# Patient Record
Sex: Female | Born: 1955 | Race: White | Hispanic: No | Smoking: Never smoker
Health system: Southern US, Community
[De-identification: ages and names within clinical notes are randomized; demographics above are authoritative.]

## PROBLEM LIST (undated history)

## (undated) HISTORY — PX: COLONOSCOPY: SHX174

## (undated) HISTORY — PX: KNEE ARTHROSCOPY: SUR90

## (undated) HISTORY — PX: BUNIONECTOMY: SHX129

## (undated) HISTORY — PX: FRACTURE SURGERY: SHX138

---

## 2006-01-27 ENCOUNTER — Ambulatory Visit: Payer: Self-pay | Admitting: Internal Medicine

## 2006-02-03 ENCOUNTER — Ambulatory Visit: Payer: Self-pay | Admitting: Internal Medicine

## 2011-03-27 ENCOUNTER — Other Ambulatory Visit: Payer: Self-pay | Admitting: Family Medicine

## 2011-03-27 ENCOUNTER — Other Ambulatory Visit (HOSPITAL_COMMUNITY)
Admission: RE | Admit: 2011-03-27 | Discharge: 2011-03-27 | Disposition: A | Discharge status: Home / Self Care | Payer: Managed Care, Other (non HMO) | Source: Ambulatory Visit | Attending: Family Medicine | Admitting: Family Medicine

## 2011-03-27 DIAGNOSIS — Z0000 Encounter for general adult medical examination without abnormal findings: Secondary | ICD-10-CM | POA: Insufficient documentation

## 2012-09-29 ENCOUNTER — Other Ambulatory Visit: Payer: Self-pay | Admitting: Family Medicine

## 2012-09-29 ENCOUNTER — Other Ambulatory Visit (HOSPITAL_COMMUNITY)
Admission: RE | Admit: 2012-09-29 | Discharge: 2012-09-29 | Disposition: A | Discharge status: Home / Self Care | Payer: BC Managed Care – PPO | Source: Ambulatory Visit | Attending: Family Medicine | Admitting: Family Medicine

## 2012-09-29 DIAGNOSIS — Z1211 Encounter for screening for malignant neoplasm of colon: Secondary | ICD-10-CM | POA: Insufficient documentation

## 2012-09-29 DIAGNOSIS — Z1151 Encounter for screening for human papillomavirus (HPV): Secondary | ICD-10-CM | POA: Insufficient documentation

## 2015-09-18 ENCOUNTER — Ambulatory Visit (INDEPENDENT_AMBULATORY_CARE_PROVIDER_SITE_OTHER): Payer: BLUE CROSS/BLUE SHIELD | Admitting: Sports Medicine

## 2015-09-18 ENCOUNTER — Encounter: Payer: Self-pay | Admitting: Sports Medicine

## 2015-09-18 VITALS — BP 100/60 | Ht 66.0 in | Wt 120.0 lb

## 2015-09-18 DIAGNOSIS — M25562 Pain in left knee: Secondary | ICD-10-CM

## 2015-09-18 MED ORDER — METHYLPREDNISOLONE ACETATE 40 MG/ML IJ SUSP
40.0000 mg | Freq: Once | INTRAMUSCULAR | Status: AC
  Administered 2015-09-18: 40 mg via INTRA_ARTICULAR

## 2015-09-18 NOTE — Progress Notes (Signed)
   Subjective:    Patient ID: Natalie Lutz, female    DOB: [birth year], 60 y.o.   MRN: [id number]  HPI chief complaint: Left knee pain  Very pleasant 60 year old female comes in today complaining of left knee pain for the past 2 months. Her pain began suddenly after she was sitting watching a movie and has persisted. 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 She took a month off but did not notice much difference. A couple of weeks ago she did start taking 400 mg of ibuprofen twice daily and using a compression sleeve. This did help some, but when she tried to return to tennis, she was unable to play due to worsening pain. Her pain is localized primarily along the medial aspect of the left knee. She denies any locking or catching. No feelings of giving way. She describes it as a stabbing type of pain. Pain improves at rest although swelling persists. She has a surgical history significant for bilateral knee arthroscopies done by Dr. Percell Miller 10 years ago. She has done well with both knees postoperatively up until this point. She denies any associated numbness or tingling. No fevers or chills. No pain more proximally in the groin.  Past medical history reviewed Surgical history reviewed. In addition to the aforementioned bilateral knee arthroscopies, she has also had 2 surgeries on her right foot for bunions. Last surgery was 4 years ago done by Dr. Beola Cord at [facility]. She takes no chronic medications No known drug allergies    Review of Systems    as above Objective:   Physical Exam  Well-developed, fit appearing. No acute distress. Vital signs reviewed  Left knee: Full range of motion. 1+ effusion. She is tender to palpation along the medial joint line with a positive Thessaly's. No tenderness along the lateral joint line. 1+ patellofemoral crepitus but a negative patellar grind. Knee is stable to valgus and varus stressing. Negative anterior drawer,  negative posterior drawer. No tenderness to palpation along the quadriceps or patellar tendons. Neurovascularly intact distally. Fairly neutral arches with standing. No obvious leg length discrepancy.  Brief ultrasound of the left knee was performed. Patient has an obvious effusion. Visualized portion of both the medial and lateral menisci show no obvious tear.      Assessment & Plan:  Left knee pain and swelling secondary to medial compartmental DJD versus possible degenerative meniscal tear Status post remote bilateral knee arthroscopies  Patient's left knee is injected with cortisone today after risks and benefits were explained. An anterior lateral approach was utilized. Patient tolerated this without difficulty. She is instructed to not play tennis for 2 more weeks. She can then increase activity as tolerated. She needs to continue with her compression sleeve when playing. She will follow-up with me in 4 weeks. If symptoms improve, then I will educate her in a home exercise program. However, if pain or swelling persists, we will need to start with getting some plain x-rays and possibly an MRI. Patient is encouraged to call with questions or concerns prior to her follow-up visit.  Consent obtained and verified. Time-out conducted. Noted no overlying erythema, induration, or other signs of local infection. Skin prepped in a sterile fashion. Topical analgesic spray: Ethyl chloride. Joint: left knee Needle: 22g 1.5 inch Completed without difficulty. Meds: 3cc 1% xylocaine, 1cc (40mg ) depomedrol  Advised to call if fevers/chills, erythema, induration, drainage, or persistent bleeding.

## 2015-09-24 ENCOUNTER — Ambulatory Visit (INDEPENDENT_AMBULATORY_CARE_PROVIDER_SITE_OTHER): Payer: BLUE CROSS/BLUE SHIELD

## 2015-09-24 ENCOUNTER — Ambulatory Visit (HOSPITAL_COMMUNITY)
Admission: EM | Admit: 2015-09-24 | Discharge: 2015-09-24 | Disposition: A | Discharge status: Home / Self Care | Payer: BLUE CROSS/BLUE SHIELD | Attending: Family Medicine | Admitting: Family Medicine

## 2015-09-24 ENCOUNTER — Encounter (HOSPITAL_COMMUNITY): Payer: Self-pay | Admitting: Emergency Medicine

## 2015-09-24 DIAGNOSIS — S52501A Unspecified fracture of the lower end of right radius, initial encounter for closed fracture: Secondary | ICD-10-CM

## 2015-09-24 MED ORDER — HYDROCODONE-ACETAMINOPHEN 5-325 MG PO TABS
1.0000 | ORAL_TABLET | Freq: Once | ORAL | Status: AC
  Administered 2015-09-24: 1 via ORAL

## 2015-09-24 MED ORDER — HYDROCODONE-ACETAMINOPHEN 5-325 MG PO TABS
ORAL_TABLET | ORAL | Status: AC
  Filled 2015-09-24: qty 1

## 2015-09-24 NOTE — ED Notes (Signed)
Pt fell an hour ago and caught herself with her right hand. PT reports pain in her right wrist and a slight pain in right forearm.

## 2015-09-24 NOTE — ED Notes (Signed)
Dr. Veronia Beets remains at bedside

## 2015-09-24 NOTE — Discharge Instructions (Signed)
See dr Amedeo Plenty and care as advised.

## 2015-09-24 NOTE — ED Notes (Signed)
Pt made aware that she cannot drive for the rest of the day or while taking oxycodone for pain.

## 2015-09-24 NOTE — ED Notes (Signed)
PT denies BP recheck prior to discharge.

## 2015-09-24 NOTE — Consult Note (Signed)
Natalie Lutz, [name] NO.:  [id number]  MEDICAL RECORD NO.:  [id number]  LOCATION:  [facility]                         FACILITY:  [place]  PHYSICIAN:  Satira Anis. Naod Sweetland, M.D.DATE OF BIRTH:  07-17-1955  DATE OF CONSULTATION: DATE OF DISCHARGE:  09/24/2015                                CONSULTATION   I had the pleasure of seeing Natalie Lutz for consultation today at the [facility].  Natalie Lutz is a pleasant 60 year old female, who fell at [facility] today sustained a displaced right distal radius fracture.  She complains of pain, swelling, and deformity.  She notes no locking, popping, catching.  At present time, she denies neck, back, chest, abdominal pain.  She denies numbness, tingling in the arm or fingers.  She is a delightful lady.  She is healthy and active.  PAST MEDICAL HISTORY:  None significant.  PAST SURGICAL HISTORY:  Reviewed.  ALLERGIES:  None.  MEDICINES:  None.  PHYSICAL EXAMINATION:  GENERAL:  A pleasant female, alert and oriented in no acute distress. VITAL SIGNS:  Stable. EXTREMITIES:  The patient has intact sensation to her finger tips. NECK, BACK, CHEST EXAMINATION:  Negative. LOWER EXTREMITY:  Nontender.  She walks with a nonantalgic gait.  She has swelling deformity about her right distal radius.  SKIN:  Intact.  No evidence of open injury, compartment syndrome, dystrophy, and infection.  I reviewed this with her at length and the findings at present time, she has obviously mildly displaced distal radius fracture.  Her x-rays show distal radius fracture displaced with some degree of early collapse and loss of angulation.  IMPRESSION:  Closed right distal radius fracture in an active 60 year old female, who enjoys tennis and other activities.  PLAN:  I have discussed with the patient the findings at present time. I would recommend splinting today followed by elective ORIF.  We have discussed relevant issues, do's and don'ts,  timeframe, duration of recovery, and other issues as they are to remain to her predicament.  We will plan to proceed with ORIF at a mutually convenient time in the future.  I applied a cast today/splint today myself, wrote for Oxy IR 5 mg 1-2 q.4-6 hours p.r.n. pain p.o. dispensed #50.  I recommended Peri-Colace, MiraLAX, as well as vitamin C 1000 mg, and yogurt and probiotic.  I have discussed with Ogechi the do's and don'ts, timeframe, duration of recovery, and other issues as they are to remain to her predicament.  We will get her scheduled at mutually convenient time in the future.     Satira Anis. Amedeo Plenty, M.D.     [facility]  D:  09/24/2015  T:  09/24/2015  Job:  [id number]

## 2015-09-24 NOTE — ED Provider Notes (Signed)
CSN: [id number]     Arrival date & time 09/24/15  1309 History   First MD Initiated Contact with Patient 09/24/15 1309     Chief Complaint  Patient presents with  . Wrist Pain   (Consider location/radiation/quality/duration/timing/severity/associated sxs/prior Treatment) Patient is a 60 y.o. female presenting with wrist pain. The history is provided by the patient.  Wrist Pain This is a new problem. The current episode started 1 to 2 hours ago (fell in [place] store onto right wrist.). The problem has not changed since onset.Pertinent negatives include no chest pain, no abdominal pain, no headaches and no shortness of breath.    History reviewed. No pertinent past medical history. Past Surgical History  Procedure Laterality Date  . Fracture surgery     No family history on file. Social History  Substance Use Topics  . Smoking status: Never Smoker   . Smokeless tobacco: None  . Alcohol Use: 0.0 oz/week    0 Standard drinks or equivalent per week   OB History    No data available     Review of Systems  Constitutional: Negative.   Respiratory: Negative for shortness of breath.   Cardiovascular: Negative for chest pain.  Gastrointestinal: Negative for abdominal pain.  Musculoskeletal: Positive for joint swelling. Negative for gait problem.  Skin: Negative.   Neurological: Negative for headaches.  All other systems reviewed and are negative.   Allergies  Review of patient's allergies indicates no known allergies.  Home Medications   Prior to Admission medications   Medication Sig Start Date End Date Taking? Authorizing Provider  valACYclovir (VALTREX) 1000 MG tablet TAKE 2 TABLETS BY MOUTH AS NEEDED FOR COLD SORES. REPEAT ONCE IN 12 HOURS EVERY 12 HOURS. 09/06/15   Historical Provider, MD  zolpidem (AMBIEN) 5 MG tablet TAKE 1 TABLET BY MOUTH AT BEDTIME AS NEEDED FOR INSOMNIA 09/06/15   Historical Provider, MD   Meds Ordered and Administered this Visit   Medications    HYDROcodone-acetaminophen (NORCO/VICODIN) 5-325 MG per tablet 1 tablet (1 tablet Oral Given 09/24/15 1350)    BP 142/68 mmHg  Pulse 76  Temp(Src) 99.5 F (37.5 C) (Oral)  Resp 16  SpO2 97% No data found.   Physical Exam  Constitutional: She is oriented to person, place, and time. She appears well-developed and well-nourished.  Musculoskeletal: She exhibits tenderness.       Right wrist: She exhibits decreased range of motion, tenderness, bony tenderness, swelling and deformity. She exhibits no effusion and no crepitus.       Arms: Neurological: She is alert and oriented to person, place, and time.  Skin: Skin is warm and dry.  Nursing note and vitals reviewed.   ED Course  Procedures (including critical care time)  Labs Review Labs Reviewed - No data to display  Imaging Review Dg Wrist Complete Right  09/24/2015  CLINICAL DATA:  Acute right wrist pain after fall today. Initial encounter. EXAM: RIGHT WRIST - COMPLETE 3+ VIEW COMPARISON:  None. FINDINGS: Mildly posteriorly displaced distal radial fracture is noted. This appears to be closed and posttraumatic. No other fracture or dislocation is noted. Joint spaces are intact. Soft tissues appear normal. IMPRESSION: Mildly displaced distal right radial fracture. Electronically Signed   By: Marijo Conception, M.D.   On: 09/24/2015 14:10     Visual Acuity Review  Right Eye Distance:   Left Eye Distance:   Bilateral Distance:    Right Eye Near:   Left Eye Near:    Bilateral Near:  MDM   1. Distal radius fracture, right, closed, initial encounter    Seen by dr Amedeo Plenty for care.   Billy Fischer, MD 09/24/15 Curly Rim

## 2015-10-14 ENCOUNTER — Ambulatory Visit: Payer: BLUE CROSS/BLUE SHIELD | Admitting: Sports Medicine

## 2015-12-24 ENCOUNTER — Other Ambulatory Visit (HOSPITAL_COMMUNITY): Payer: Self-pay | Admitting: Obstetrics and Gynecology

## 2015-12-25 ENCOUNTER — Other Ambulatory Visit (HOSPITAL_COMMUNITY): Payer: Self-pay | Admitting: Obstetrics and Gynecology

## 2015-12-31 NOTE — Patient Instructions (Signed)
Your procedure is scheduled on:  Wednesday, January 08, 2016  Enter through the [employer] [facility] at:  7:00 AM  Pick up the phone at the desk and dial [phone].  Call this number if you have problems the morning of surgery: [phone].  Remember: Do NOT eat food or drink after:  Midnight Tuesday  Take these medicines the morning of surgery with a SIP OF WATER:  None  Do NOT wear jewelry (body piercing), metal hair clips/bobby pins, make-up, or nail polish. Do NOT wear lotions, powders, or perfumes.  You may wear deodorant. Do NOT shave for 48 hours prior to surgery. Do NOT bring valuables to the hospital. Contacts, dentures, or bridgework may not be worn into surgery.  Leave suitcase in car.  After surgery it may be brought to your room.  For patients admitted to the hospital, checkout time is 11:00 AM the day of discharge.

## 2016-01-01 ENCOUNTER — Encounter (HOSPITAL_COMMUNITY): Payer: Self-pay

## 2016-01-01 ENCOUNTER — Other Ambulatory Visit: Payer: Self-pay

## 2016-01-01 ENCOUNTER — Encounter (HOSPITAL_COMMUNITY)
Admission: RE | Admit: 2016-01-01 | Discharge: 2016-01-01 | Disposition: A | Discharge status: Home / Self Care | Payer: BLUE CROSS/BLUE SHIELD | Source: Ambulatory Visit | Attending: Obstetrics and Gynecology | Admitting: Obstetrics and Gynecology

## 2016-01-01 DIAGNOSIS — R001 Bradycardia, unspecified: Secondary | ICD-10-CM | POA: Insufficient documentation

## 2016-01-01 DIAGNOSIS — Z01812 Encounter for preprocedural laboratory examination: Secondary | ICD-10-CM | POA: Diagnosis not present

## 2016-01-01 DIAGNOSIS — Z01810 Encounter for preprocedural cardiovascular examination: Secondary | ICD-10-CM | POA: Diagnosis not present

## 2016-01-01 LAB — TYPE AND SCREEN
ABO/RH(D): O NEG
Antibody Screen: NEGATIVE

## 2016-01-01 LAB — BASIC METABOLIC PANEL
Anion gap: 7 (ref 5–15)
BUN: 16 mg/dL (ref 6–20)
CHLORIDE: 104 mmol/L (ref 101–111)
CO2: 26 mmol/L (ref 22–32)
CREATININE: 0.82 mg/dL (ref 0.44–1.00)
Calcium: 10 mg/dL (ref 8.9–10.3)
GFR calc Af Amer: >60 mL/min (ref >60)
GFR calc non Af Amer: >60 mL/min (ref >60)
Glucose, Bld: 84 mg/dL (ref 65–99)
Potassium: 4.3 mmol/L (ref 3.5–5.1)
SODIUM: 137 mmol/L (ref 135–145)

## 2016-01-01 LAB — ABO/RH: ABO/RH(D): O NEG

## 2016-01-01 LAB — CBC
HCT: 39.5 % (ref 36.0–46.0)
Hemoglobin: 13.8 g/dL (ref 12.0–15.0)
MCH: 30.7 pg (ref 26.0–34.0)
MCHC: 34.9 g/dL (ref 30.0–36.0)
MCV: 88 fL (ref 78.0–100.0)
PLATELETS: 254 10*3/uL (ref 150–400)
RBC: 4.49 MIL/uL (ref 3.87–5.11)
RDW: 12.7 % (ref 11.5–15.5)
WBC: 5.1 10*3/uL (ref 4.0–10.5)

## 2016-01-01 NOTE — Pre-Procedure Instructions (Signed)
Dr. Hatchett reviewed EKG no new orders received at this time. 

## 2016-01-05 ENCOUNTER — Other Ambulatory Visit (HOSPITAL_COMMUNITY): Payer: Self-pay | Admitting: Obstetrics and Gynecology

## 2016-01-05 NOTE — H&P (Signed)
Chief Complaint(s):   preop history and phyiscal for 01/08/2016   HPI:  General 60 y/o presents for preop history and physical exam in preparation for vaginal hysterectomy/BSO with anterior repair to manage pelvic prolapse. she has pressue and discomfort due to the proplapse. she reports that 13 yrs ago her current partner stated that he " could not feel anything" when they have sex. she reports that she has significant discomfort during sex for the last 10 years.  Current Medication:  Taking  Zolpidem Tartrate 5 MG Tablet 1 tablet at bedtime Orally Once a day as needed for insomnia     Valtrex(ValACYclovir HCl) 1000mg  Tablet 2 tablets as needed for cold sores, repeat once in 12 hours by mouth every 12 hrs, Notes: as needed     Premarin(Estrogens Conjugated) 0.625 MG/GM Cream 1/2 gram per vagina twice a weekly Vaginal     Medication List reviewed and reconciled with the patient   Medical History:   h/o cold sores     sees Dr. Mickel Baas [place]/Dr Steinhoffer for skin checks every 1-2 years, h/o squamous cell CA, basal cell CA     osteopenia, -1.3     GYN--Dr McPhail, released to primary care     R distal radius fx 4/17 Dr Irena Reichmann      Allergies/Intolerance:   N.K.D.A.   Gyn History:   Sexual activity currently sexually active. Periods : postmenopausal. LMP 2011. Last pap smear date 09/29/12. Last mammogram date 03/27/15. Denies Abnormal pap smear. Denies STD.   OB History:   Number of pregnancies 3. Pregnancy # 1 live birth, vaginal delivery. Pregnancy # 2 live birth, vaginal delivery. Pregnancy # 3 live birth, vaginal delivery.   Surgical History:   B arthroscopic knee surgery--Dr Percell Miller 08     B bunionectomy 1977     redo, R bunionectomy, Dr Beola Cord 12/13     repair of broken right wrist 09/2015   Hospitalization:   childbirth 66, 26, 78   Family History:   Father: alive 70 yrs    Mother: deceased 52s yrs, lung issues--not a smoker    Paternal Grand Father: deceased    Paternal  Imbery Mother: deceased    Maternal Grand Father: deceased, diabetes, diagnosed with DM    Maternal Grand Mother: deceased    Brother 1: alive    Sister 1: alive    Sister 2: alive    1 brother(s) , 2 sister(s) - healthy.    denies family h/o gyn cancers.  Social History:  General Tobacco use cigarettes: Never smoked, Tobacco history last updated 12/25/2015.  no EXPOSURE TO PASSIVE SMOKE.  Alcohol: yes, wine, 2 glasses at a time, 3-5 times per week.  no Recreational drug use.  Exercise: yes, tennis 2 times per week, personal trainer 2 times per week.  Marital Status: Divorced 2001, stable relationship--since 2008, [place].  Children: 2, girls, 1, Boys, Estella Husk (married 3/16), Arvella Nigh (daughter)--Winston (1 granddaughter), married, Jamie--[place] (son).  OCCUPATION: employed, self employed, Museum/gallery curator, residential.  Religion: Tourist information centre manager.  Seat belt use: yes.  ROS: CONSTITUTIONAL none" options="no,yes" propid="91" itemid="[id number]" categoryid="10464" encounterid="[id number]"Fatigue none. none today" options="no,yes" propid="91" itemid="10467" categoryid="10464" encounterid="[id number]"Fever none today.  CARDIOLOGY none" options="no,yes" propid="91" itemid="[id number]" categoryid="10488" encounterid="[id number]"Chest pain none.  RESPIRATORY no" options="no" propid="91" itemid="270013" categoryid="138132" encounterid="[id number]"Shortness of breath no. no" options="no,yes" propid="91" itemid="172745" categoryid="138132" encounterid="[id number]"Cough no.  GASTROENTEROLOGY none" options="no,yes" propid="91" itemid="193447" categoryid="10494" encounterid="[id number]"Appetite change none. no" options="no,yes" propid="91" itemid="193449" categoryid="10494" encounterid="[id number]"Change in bowel habits no.  FEMALE REPRODUCTIVE no" options="no,yes" propid="91" itemid="196298" categoryid="10525" encounterid="[id number]"Breast lumps or discharge  no. none" options="no,yes" propid="91"  itemid="186083" categoryid="10525" encounterid="[id number]"Breast pain none. none" options="no,yes" propid="91" itemid="138198" categoryid="10525" encounterid="[id number]"Dyspareunia none. no" options="no,yes" propid="91" itemid="202654" categoryid="10525" encounterid="[id number]"Dysuria no. none" options="no,yes" propid="91" itemid="186082" categoryid="10525" encounterid="[id number]"Pelvic pain none. yes" options="no,yes" propid="91" itemid="199173" categoryid="10525" encounterid="[id number]"Regular menses yes. no" options="no,yes" propid="91" itemid="278230" categoryid="10525" encounterid="[id number]"Unusual vaginal discharge no. no" options="no,yes" propid="91" itemid="278942" categoryid="10525" encounterid="[id number]"Vaginal itching no. no" options="no,yes" propid="91" itemid="278837" categoryid="10525" encounterid="[id number]"Vulvar/labial lesion no.  NEUROLOGY none" options="no,yes" propid="91" itemid="193627" categoryid="12512" encounterid="[id number]"Migraines none. none" options="no,yes" propid="91" itemid="12514" categoryid="12512" encounterid="[id number]"Tingling/numbness none. none" options="no,yes" propid="91" itemid="193467" categoryid="12512" encounterid="[id number]"Visual changes none.  PSYCHOLOGY no" options="" propid="91" itemid="275919" categoryid="10520" encounterid="[id number]"Depression no.  SKIN no" options="no,yes" propid="91" itemid="269383" categoryid="202750" encounterid="[id number]"Rash no. no" options="no,yes" propid="91" itemid="202757" categoryid="202750" encounterid="[id number]"Suspicious lesions no.  ENDOCRINOLOGY none" options="no,yes" propid="91" itemid="202624" categoryid="12508" encounterid="[id number]"Hot flashes none. no unintentional" options="no,yes" propid="91" itemid="193436" categoryid="12508" encounterid="[id number]"Weight gain no unintentional. none" options="no,yes" propid="91" itemid="138164" categoryid="12508" encounterid="[id number]"Weight loss none.  HEMATOLOGY/LYMPH no" options="no,yes" propid="91"  itemid="193454" categoryid="138157" encounterid="[id number]"Anemia no.    Objective: Vitals:  Wt 119, Wt change 2 lb, Pulse sitting 66, BP sitting 107/61  Past Results:  Examination:  General Examination McCoy,Tiffany 12/25/2015 02:58:30 PM &gt; , for pelvic exam only" categoryPropId="[id number]" examid="[id number]"CHAPERONE PRESENT McCoy,Tiffany 12/25/2015 02:58:30 PM > , for pelvic exam only.  Physical Examination: GENERAL in NAD, pleasant"Patient appears in NAD, pleasant. well developed"Build: well developed. well-appearing, well-developed"General Appearance: well-appearing, well-developed. caucasian"Race: caucasian.  LUNGS clear to auscultation"Breath sounds: clear to auscultation. no"Dyspnea: no.  HEART none"Murmurs: none. normal"Rate: normal. regular"Rhythm: regular.  ABDOMEN no masses,tenderness,organomegaly, no CVAT"General: no masses,tenderness,organomegaly, no CVAT.  FEMALE GENITOURINARY no mass, non tender"Adnexa: no mass, non tender. normal, no lesions"Anus/perineum: normal, no lesions. normal appearance , no lesions/discharge/bleeding,, ..GRADE 2-3 uterine prolapse , external os normal "Cervix/ cuff: normal appearance , no lesions/discharge/bleeding,, ..GRADE 2-3 uterine prolapse , external os normal . normal, no lesions, no skin discoloration, no lymphadenopathy"External genitalia: normal, no lesions, no skin discoloration, no lymphadenopathy. deferred"Rectum: deferred. normal external meatus"Urethra: normal external meatus. normal size/shape/consistency, freely mobile, non tender"Uterus: normal size/shape/consistency, freely mobile, non tender. moderate cystocele.. small rectocele no lesions, no abnormal discharge, odorless"Vagina: moderate cystocele.. small rectocele no lesions, no abnormal discharge, odorless. normal, no lesions, no skin discoloration, non tender"Vulva: normal, no lesions, no skin discoloration, non tender.  EXTREMITIES FROM of all extremities"Extremities FROM of all  extremities.  NEUROLOGICAL normal"Gait: normal. alert and oriented x 3"Orientation: alert and oriented x 3.    Assessment: Assessment:  Uterine prolapse - N81.4 (Primary)     Midline cystocele - N81.11     Rectocele - N81.6     Plan: Treatment:  Uterine prolapse  Notes: pt desires definitive management via hysterectomy.. .. plan on vaginal hysterectomy with BSO .. modified mccalls/ culdoplasty.Marland Kitchen anterior repair.. d/w pt r/b/a of hysterectomy inclugind but not limited to infection/ bleeding damage to bowel bladder and ureters with the need for further surgery. r/o transfusion HIV / HEp B&C discussed . .. r/o conversion to laparotomy discussed. pt voiced understanding and desires to proceed.  Midline cystocele  Notes: plan anterior repair at the time of hysterectomy.  Rectocele  Notes: this is minimal ... this will most likely not need repair however will evaluate at the time of surgery if necessary will proceed wth posterior repair at that time.

## 2016-01-08 ENCOUNTER — Ambulatory Visit (HOSPITAL_COMMUNITY): Payer: BLUE CROSS/BLUE SHIELD | Admitting: Certified Registered Nurse Anesthetist

## 2016-01-08 ENCOUNTER — Encounter (HOSPITAL_COMMUNITY): Payer: Self-pay | Admitting: Certified Registered Nurse Anesthetist

## 2016-01-08 ENCOUNTER — Encounter (HOSPITAL_COMMUNITY)
Admission: RE | Disposition: A | Discharge status: Home / Self Care | Payer: Self-pay | Source: Ambulatory Visit | Attending: Obstetrics and Gynecology

## 2016-01-08 ENCOUNTER — Observation Stay (HOSPITAL_COMMUNITY)
Admission: RE | Admit: 2016-01-08 | Discharge: 2016-01-09 | Disposition: A | Discharge status: Home / Self Care | Payer: BLUE CROSS/BLUE SHIELD | Source: Ambulatory Visit | Attending: Obstetrics and Gynecology | Admitting: Obstetrics and Gynecology

## 2016-01-08 DIAGNOSIS — Z90710 Acquired absence of both cervix and uterus: Secondary | ICD-10-CM | POA: Diagnosis present

## 2016-01-08 DIAGNOSIS — N814 Uterovaginal prolapse, unspecified: Secondary | ICD-10-CM | POA: Diagnosis not present

## 2016-01-08 HISTORY — PX: SALPINGOOPHORECTOMY: SHX82

## 2016-01-08 HISTORY — PX: VAGINAL HYSTERECTOMY: SHX2639

## 2016-01-08 HISTORY — PX: CYSTOCELE REPAIR: SHX163

## 2016-01-08 SURGERY — HYSTERECTOMY, VAGINAL
Anesthesia: General | Site: Vagina

## 2016-01-08 MED ORDER — LIDOCAINE HCL (PF) 1 % IJ SOLN
INTRAMUSCULAR | Status: AC
  Filled 2016-01-08: qty 5

## 2016-01-08 MED ORDER — SENNA 8.6 MG PO TABS
1.0000 | ORAL_TABLET | Freq: Two times a day (BID) | ORAL | Status: DC
  Administered 2016-01-08: 8.6 mg via ORAL
  Filled 2016-01-08 (×3): qty 1

## 2016-01-08 MED ORDER — FENTANYL CITRATE (PF) 250 MCG/5ML IJ SOLN
INTRAMUSCULAR | Status: AC
  Filled 2016-01-08: qty 5

## 2016-01-08 MED ORDER — LIDOCAINE-EPINEPHRINE 1 %-1:100000 IJ SOLN
INTRAMUSCULAR | Status: AC
  Filled 2016-01-08: qty 2

## 2016-01-08 MED ORDER — LACTATED RINGERS IV SOLN
INTRAVENOUS | Status: DC
  Administered 2016-01-08 – 2016-01-09 (×3): via INTRAVENOUS

## 2016-01-08 MED ORDER — ALUM & MAG HYDROXIDE-SIMETH 200-200-20 MG/5ML PO SUSP: 30.0000 mL | ORAL | Status: DC | PRN

## 2016-01-08 MED ORDER — ZOLPIDEM TARTRATE 5 MG PO TABS: 5.0000 mg | ORAL_TABLET | Freq: Every evening | ORAL | Status: DC | PRN

## 2016-01-08 MED ORDER — HYDROMORPHONE HCL 1 MG/ML IJ SOLN: 0.2000 mg | INTRAMUSCULAR | Status: DC | PRN

## 2016-01-08 MED ORDER — GLYCOPYRROLATE 0.2 MG/ML IJ SOLN
INTRAMUSCULAR | Status: AC
  Filled 2016-01-08: qty 3

## 2016-01-08 MED ORDER — PROPOFOL 10 MG/ML IV BOLUS
INTRAVENOUS | Status: DC | PRN
  Administered 2016-01-08: 140 mg via INTRAVENOUS

## 2016-01-08 MED ORDER — MIDAZOLAM HCL 2 MG/2ML IJ SOLN
INTRAMUSCULAR | Status: DC | PRN
  Administered 2016-01-08: 2 mg via INTRAVENOUS

## 2016-01-08 MED ORDER — EPHEDRINE SULFATE 50 MG/ML IJ SOLN
INTRAMUSCULAR | Status: DC | PRN
  Administered 2016-01-08: 10 mg via INTRAVENOUS

## 2016-01-08 MED ORDER — HYDROMORPHONE HCL 1 MG/ML IJ SOLN
0.5000 mg | INTRAMUSCULAR | Status: DC | PRN
  Administered 2016-01-08 (×2): 0.5 mg via INTRAVENOUS

## 2016-01-08 MED ORDER — NEOSTIGMINE METHYLSULFATE 10 MG/10ML IV SOLN
INTRAVENOUS | Status: AC
  Filled 2016-01-08: qty 1

## 2016-01-08 MED ORDER — ONDANSETRON HCL 4 MG/2ML IJ SOLN
INTRAMUSCULAR | Status: DC | PRN
  Administered 2016-01-08: 4 mg via INTRAVENOUS

## 2016-01-08 MED ORDER — SIMETHICONE 80 MG PO CHEW: 80.0000 mg | CHEWABLE_TABLET | Freq: Four times a day (QID) | ORAL | Status: DC | PRN

## 2016-01-08 MED ORDER — MIDAZOLAM HCL 2 MG/2ML IJ SOLN
INTRAMUSCULAR | Status: AC
  Filled 2016-01-08: qty 2

## 2016-01-08 MED ORDER — SCOPOLAMINE 1 MG/3DAYS TD PT72
1.0000 | MEDICATED_PATCH | Freq: Once | TRANSDERMAL | Status: DC
  Administered 2016-01-08: 1.5 mg via TRANSDERMAL

## 2016-01-08 MED ORDER — ONDANSETRON HCL 4 MG/2ML IJ SOLN: 4.0000 mg | Freq: Once | INTRAMUSCULAR | Status: DC | PRN

## 2016-01-08 MED ORDER — KETOROLAC TROMETHAMINE 30 MG/ML IJ SOLN
INTRAMUSCULAR | Status: AC
  Filled 2016-01-08: qty 1

## 2016-01-08 MED ORDER — PROPOFOL 10 MG/ML IV BOLUS
INTRAVENOUS | Status: AC
  Filled 2016-01-08: qty 20

## 2016-01-08 MED ORDER — KETOROLAC TROMETHAMINE 30 MG/ML IJ SOLN
INTRAMUSCULAR | Status: DC | PRN
  Administered 2016-01-08: 30 mg via INTRAVENOUS

## 2016-01-08 MED ORDER — LIDOCAINE-EPINEPHRINE 1 %-1:100000 IJ SOLN
INTRAMUSCULAR | Status: DC | PRN
  Administered 2016-01-08: 9 mL
  Administered 2016-01-08: 20 mL

## 2016-01-08 MED ORDER — ARTIFICIAL TEARS OP OINT
TOPICAL_OINTMENT | OPHTHALMIC | Status: AC
  Filled 2016-01-08: qty 3.5

## 2016-01-08 MED ORDER — EPHEDRINE 5 MG/ML INJ
INTRAVENOUS | Status: AC
  Filled 2016-01-08: qty 10

## 2016-01-08 MED ORDER — SCOPOLAMINE 1 MG/3DAYS TD PT72
MEDICATED_PATCH | TRANSDERMAL | Status: AC
  Administered 2016-01-08: 1.5 mg via TRANSDERMAL
  Filled 2016-01-08: qty 1

## 2016-01-08 MED ORDER — OXYCODONE-ACETAMINOPHEN 5-325 MG PO TABS
1.0000 | ORAL_TABLET | ORAL | Status: DC | PRN
  Administered 2016-01-08: 2 via ORAL
  Filled 2016-01-08: qty 2

## 2016-01-08 MED ORDER — CEFAZOLIN SODIUM-DEXTROSE 2-4 GM/100ML-% IV SOLN
2.0000 g | INTRAVENOUS | Status: AC
  Administered 2016-01-08: 2 g via INTRAVENOUS

## 2016-01-08 MED ORDER — LIDOCAINE HCL (CARDIAC) 10 MG/ML IV SOLN
INTRAVENOUS | Status: DC | PRN
  Administered 2016-01-08: 50 mg via INTRAVENOUS

## 2016-01-08 MED ORDER — ONDANSETRON HCL 4 MG/2ML IJ SOLN
INTRAMUSCULAR | Status: AC
  Filled 2016-01-08: qty 2

## 2016-01-08 MED ORDER — ESTRADIOL 0.1 MG/GM VA CREA: TOPICAL_CREAM | VAGINAL | Status: DC

## 2016-01-08 MED ORDER — DEXAMETHASONE SODIUM PHOSPHATE 4 MG/ML IJ SOLN
INTRAMUSCULAR | Status: DC | PRN
  Administered 2016-01-08: 4 mg via INTRAVENOUS

## 2016-01-08 MED ORDER — DEXAMETHASONE SODIUM PHOSPHATE 4 MG/ML IJ SOLN
INTRAMUSCULAR | Status: AC
  Filled 2016-01-08: qty 1

## 2016-01-08 MED ORDER — ONDANSETRON HCL 4 MG/2ML IJ SOLN: 4.0000 mg | Freq: Four times a day (QID) | INTRAMUSCULAR | Status: DC | PRN

## 2016-01-08 MED ORDER — ESTRADIOL 0.1 MG/GM VA CREA
TOPICAL_CREAM | VAGINAL | Status: AC
  Filled 2016-01-08: qty 42.5

## 2016-01-08 MED ORDER — KETOROLAC TROMETHAMINE 30 MG/ML IJ SOLN: 30.0000 mg | Freq: Four times a day (QID) | INTRAMUSCULAR | Status: DC

## 2016-01-08 MED ORDER — IBUPROFEN 800 MG PO TABS: 800.0000 mg | ORAL_TABLET | Freq: Three times a day (TID) | ORAL | Status: DC | PRN

## 2016-01-08 MED ORDER — KETOROLAC TROMETHAMINE 30 MG/ML IJ SOLN
30.0000 mg | Freq: Four times a day (QID) | INTRAMUSCULAR | Status: DC
  Administered 2016-01-08 – 2016-01-09 (×3): 30 mg via INTRAVENOUS
  Filled 2016-01-08 (×3): qty 1

## 2016-01-08 MED ORDER — FENTANYL CITRATE (PF) 250 MCG/5ML IJ SOLN
INTRAMUSCULAR | Status: DC | PRN
  Administered 2016-01-08: 50 ug via INTRAVENOUS
  Administered 2016-01-08: 100 ug via INTRAVENOUS
  Administered 2016-01-08: 25 ug via INTRAVENOUS

## 2016-01-08 MED ORDER — HYDROMORPHONE HCL 1 MG/ML IJ SOLN
INTRAMUSCULAR | Status: AC
  Administered 2016-01-08: 0.5 mg via INTRAVENOUS
  Filled 2016-01-08: qty 1

## 2016-01-08 MED ORDER — LACTATED RINGERS IV SOLN
INTRAVENOUS | Status: DC
  Administered 2016-01-08 (×3): via INTRAVENOUS

## 2016-01-08 MED ORDER — ONDANSETRON HCL 4 MG PO TABS: 4.0000 mg | ORAL_TABLET | Freq: Four times a day (QID) | ORAL | Status: DC | PRN

## 2016-01-08 SURGICAL SUPPLY — 35 items
CANISTER SUCT 3000ML (MISCELLANEOUS) ×3 IMPLANT
CLOTH BEACON ORANGE TIMEOUT ST (SAFETY) ×3 IMPLANT
CONT PATH 16OZ SNAP LID 3702 (MISCELLANEOUS) ×1 IMPLANT
CONTAINER PREFILL 10% NBF 60ML (FORM) ×3 IMPLANT
DECANTER SPIKE VIAL GLASS SM (MISCELLANEOUS) ×1 IMPLANT
DRAPE SHEET LG 3/4 BI-LAMINATE (DRAPES) ×1 IMPLANT
ELECT LIGASURE LONG (ELECTRODE) IMPLANT
ELECT LIGASURE SHORT 9 REUSE (ELECTRODE) ×1 IMPLANT
GAUZE PACKING 1/2X5YD (GAUZE/BANDAGES/DRESSINGS) ×1 IMPLANT
GAUZE PACKING 2X5 YD STRL (GAUZE/BANDAGES/DRESSINGS) IMPLANT
GLOVE BIOGEL M 6.5 STRL (GLOVE) ×6 IMPLANT
GLOVE BIOGEL PI IND STRL 6.5 (GLOVE) ×4 IMPLANT
GLOVE BIOGEL PI IND STRL 7.0 (GLOVE) ×2 IMPLANT
GLOVE BIOGEL PI INDICATOR 6.5 (GLOVE) ×2
GLOVE BIOGEL PI INDICATOR 7.0 (GLOVE) ×1
GOWN STRL REUS W/TWL LRG LVL3 (GOWN DISPOSABLE) ×12 IMPLANT
NDL SPNL 22GX3.5 QUINCKE BK (NEEDLE) IMPLANT
NEEDLE HYPO 22GX1.5 SAFETY (NEEDLE) ×1 IMPLANT
NEEDLE SPNL 22GX3.5 QUINCKE BK (NEEDLE) ×3 IMPLANT
NS IRRIG 1000ML POUR BTL (IV SOLUTION) ×3 IMPLANT
PACK VAGINAL WOMENS (CUSTOM PROCEDURE TRAY) ×3 IMPLANT
PAD OB MATERNITY 4.3X12.25 (PERSONAL CARE ITEMS) ×3 IMPLANT
SET CYSTO W/LG BORE CLAMP LF (SET/KITS/TRAYS/PACK) IMPLANT
SUT CHROMIC 2 0 CT 1 (SUTURE) ×6 IMPLANT
SUT VIC AB 0 CT1 18XCR BRD8 (SUTURE) ×6 IMPLANT
SUT VIC AB 0 CT1 36 (SUTURE) ×6 IMPLANT
SUT VIC AB 0 CT1 8-18 (SUTURE) ×9
SUT VIC AB 2-0 CT1 (SUTURE) IMPLANT
SUT VIC AB 2-0 CT2 27 (SUTURE) ×12 IMPLANT
SUT VIC AB 2-0 SH 27 (SUTURE)
SUT VIC AB 2-0 SH 27XBRD (SUTURE) IMPLANT
SUT VICRYL 1 TIES 12X18 (SUTURE) ×3 IMPLANT
TOWEL OR 17X24 6PK STRL BLUE (TOWEL DISPOSABLE) ×6 IMPLANT
TRAY FOLEY CATH SILVER 14FR (SET/KITS/TRAYS/PACK) ×3 IMPLANT
WATER STERILE IRR 1000ML POUR (IV SOLUTION) ×3 IMPLANT

## 2016-01-08 NOTE — Anesthesia Postprocedure Evaluation (Signed)
Anesthesia Post Note  Patient: Natalie Lutz  Procedure(s) Performed: Procedure(s) (LRB): HYSTERECTOMY VAGINAL (N/A) BILATERAL SALPINGO OOPHORECTOMY (Bilateral) POSSIBLE ANTERIOR REPAIR (N/A)  Patient location during evaluation: PACU Anesthesia Type: General Level of consciousness: awake, awake and alert, oriented and patient cooperative Pain management: pain level controlled Vital Signs Assessment: post-procedure vital signs reviewed and stable Respiratory status: spontaneous breathing and respiratory function stable Cardiovascular status: stable and blood pressure returned to baseline Anesthetic complications: no    Last Vitals:  Vitals:   01/08/16 1100 01/08/16 1105  BP: (!) 91/51 (!) 95/59  Pulse: (!) 46 (!) 45  Resp: 14 15  Temp:      Last Pain:  Vitals:   01/08/16 0709  TempSrc: Oral                 Kobie Matkins EDWARD

## 2016-01-08 NOTE — Anesthesia Procedure Notes (Signed)
Procedure Name: LMA Insertion Date/Time: 01/08/2016 8:34 AM Performed by: Bufford Spikes Pre-anesthesia Checklist: Patient identified, Emergency Drugs available, Suction available, Patient being monitored and Timeout performed Patient Re-evaluated:Patient Re-evaluated prior to inductionOxygen Delivery Method: Circle system utilized Preoxygenation: Pre-oxygenation with 100% oxygen Intubation Type: IV induction Ventilation: Mask ventilation without difficulty LMA: LMA inserted LMA Size: 3.0 Grade View: Grade I Tube type: Oral Number of attempts: 1 Placement Confirmation: positive ETCO2 and breath sounds checked- equal and bilateral Tube secured with: Tape Dental Injury: Teeth and Oropharynx as per pre-operative assessment

## 2016-01-08 NOTE — Op Note (Signed)
01/08/2016  10:52 AM  PATIENT:  Natalie Lutz  60 y.o. female  PRE-OPERATIVE DIAGNOSIS:  N81.4 Uterine Prolapse N81.11 Midline Cystocele  POST-OPERATIVE DIAGNOSIS:  uterine prolapse, midline cystocele  PROCEDURE:  Procedure(s): HYSTERECTOMY VAGINAL (N/A) BILATERAL SALPINGO OOPHORECTOMY (Bilateral) POSSIBLE ANTERIOR REPAIR (N/A)  SURGEON:  Surgeon(s) and Role:    * Christophe Louis, MD - Primary    * Janyth Pupa, DO - Assisting  PHYSICIAN ASSISTANT:   ASSISTANTS: Dr. Janyth Pupa assisted due to the complexity of the surgery   ANESTHESIA:   general  EBL:  Total I/O In: 1300 [I.V.:1300] Out: 350 [Urine:200; Blood:150]  BLOOD ADMINISTERED:none  DRAINS: Urinary Catheter (Foley)   LOCAL MEDICATIONS USED:  LIDOCAINE   SPECIMEN:  Source of Specimen:  uterus cervix and bilateral fallopian tubes and ovaries   DISPOSITION OF SPECIMEN:  PATHOLOGY  COUNTS:  YES  TOURNIQUET:  * No tourniquets in log *  DICTATION: .Dragon Dictation  PLAN OF CARE: Admit for overnight observation  PATIENT DISPOSITION:  PACU - hemodynamically stable.   Delay start of Pharmacological VTE agent (>24 hrs) due to surgical blood loss or risk of bleeding: not applicable  Findings normal appearing uterus. Grade 3 uterine prolapse. Small cystocele. Small Rectocele.  Normal fallopian tubes. Normal left ovary. The right ovary contained a 3 cm simple cyst.   Procedure: The patient was taken to the operating room placed under general anesthesia. Prepped and draped in the normal sterile fashion. A foley catheter was placed. A weighted speculum was placed in to the vagina and the cervix was grasped with a toothed tenaculum. The cervix was then injected circumferentially with 1% xylocaine with 1:100K of epinephrine. The cervix was then circumferentially incised with the Bovie and the bladder was dissected off the pubovesical cervical fascia. The anterior cul-de-sac as entered sharply. The same procedure was performed  posteriorly and the posterior cu-lde-sac was entered sharply without difficulty. A Heaney y clamp was placed over the uterosacral ligaments bilaterally., These were transected and suture ligated with 0 vicryl. The cardinal ligaments were then clamped bilaterally and cauterized with the ligasure.  They were then transected with mayo scissors. The uterine arteries and the broad ligament were then serially clamped  And cauterized with ligasure.  And then transected with mayo  scissors. . Excellent hemostasis was visualized. Both cornu were clamped with Heaney clamps, transected and the uterus was delivered. Theses pedicles were then  Grasped with babcock clamp. The infundibulopelvic ligament was clamped with ligasure cauterized and then the fallopian tube and ovary were excised with mayo scissors. Excellent hemostasis was noted.   A McCall's Culdoplasty was performed using 0 vicryl.   Attention was turned to the Anterior vaginal mucosa. The mucosa was injected with lidocaine with epinephrine. The vaginal mucosa was dissected off the pubovesical fascia. The pubovesical fascia was re-approximated using interrupted suture of 0 vicryl. The redundant vaginal mucosa was excised.   The vaginal cuff angles were closed with an angle suture of 0 vicryl and transfixed to the ipsilateral uterosacral ligaments. The remainder of the vaginal cuff was closed with 0 vicryl in a running locked fashion. All instruments were removed from the vagina and the patient was taken to the recovery room awake and in stable condition.   Sponge lap and needle counts were correct times 2.

## 2016-01-08 NOTE — Transfer of Care (Signed)
Immediate Anesthesia Transfer of Care Note  Patient: Natalie Lutz  Procedure(s) Performed: Procedure(s): HYSTERECTOMY VAGINAL (N/A) BILATERAL SALPINGO OOPHORECTOMY (Bilateral) POSSIBLE ANTERIOR REPAIR (N/A)  Patient Location: PACU  Anesthesia Type:General  Level of Consciousness: awake, alert  and oriented  Airway & Oxygen Therapy: Patient Spontanous Breathing and Patient connected to nasal cannula oxygen  Post-op Assessment: Report given to RN and Post -op Vital signs reviewed and stable  Post vital signs: Reviewed and stable  Last Vitals:  Vitals:   01/08/16 0709 01/08/16 1046  BP: 114/73 104/61  Pulse: (!) 59 (!) 54  Resp: 20 20  Temp: 36.4 C 36.8 C    Last Pain:  Vitals:   01/08/16 0709  TempSrc: Oral      Patients Stated Pain Goal: 3 ([id number])  Complications: No apparent anesthesia complications

## 2016-01-08 NOTE — H&P (Signed)
Date of Initial H&P:01/05/2016  History reviewed, patient examined, no change in status, stable for surgery.

## 2016-01-08 NOTE — Anesthesia Preprocedure Evaluation (Signed)
Anesthesia Evaluation  Patient identified by MRN, date of birth, ID band Patient awake    Reviewed: Allergy & Precautions, NPO status , Patient's Chart, lab work & pertinent test results  Airway Mallampati: I  TM Distance: >3 FB     Dental   Pulmonary    Pulmonary exam normal        Cardiovascular Normal cardiovascular exam     Neuro/Psych    GI/Hepatic   Endo/Other    Renal/GU      Musculoskeletal   Abdominal   Peds  Hematology   Anesthesia Other Findings   Reproductive/Obstetrics                             Anesthesia Physical Anesthesia Plan  ASA: I  Anesthesia Plan: General   Post-op Pain Management:    Induction: Intravenous  Airway Management Planned: Oral ETT and LMA  Additional Equipment:   Intra-op Plan:   Post-operative Plan: Extubation in OR  Informed Consent: I have reviewed the patients History and Physical, chart, labs and discussed the procedure including the risks, benefits and alternatives for the proposed anesthesia with the patient or authorized representative who has indicated his/her understanding and acceptance.     Plan Discussed with:   Anesthesia Plan Comments:         Anesthesia Quick Evaluation

## 2016-01-09 DIAGNOSIS — N814 Uterovaginal prolapse, unspecified: Secondary | ICD-10-CM | POA: Diagnosis present

## 2016-01-09 LAB — CBC
HCT: 32.4 % — ABNORMAL LOW (ref 36.0–46.0)
HEMOGLOBIN: 10.8 g/dL — AB (ref 12.0–15.0)
MCH: 30.2 pg (ref 26.0–34.0)
MCHC: 33.3 g/dL (ref 30.0–36.0)
MCV: 90.5 fL (ref 78.0–100.0)
Platelets: 190 10*3/uL (ref 150–400)
RBC: 3.58 MIL/uL — AB (ref 3.87–5.11)
RDW: 12.9 % (ref 11.5–15.5)
WBC: 6 10*3/uL (ref 4.0–10.5)

## 2016-01-09 MED ORDER — IBUPROFEN 800 MG PO TABS: 800.0000 mg | ORAL_TABLET | Freq: Three times a day (TID) | ORAL | 1 refills | Status: DC | PRN

## 2016-01-09 MED ORDER — OXYCODONE-ACETAMINOPHEN 5-325 MG PO TABS: 1.0000 | ORAL_TABLET | Freq: Four times a day (QID) | ORAL | 0 refills | Status: DC | PRN

## 2016-01-09 NOTE — Discharge Summary (Signed)
Physician Discharge Summary  Patient ID: Natalie Lutz MRN: [id number] DOB/AGE: 61-Aug-1957 61 y.o.  Admit date: 01/08/2016 Discharge date: 01/09/2016  Admission Diagnoses: Pelvic prolapse. 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 She did will posoperative with return of bladder function. Pain is well controlled. Bowel sounds present in all 4 quadrants.   Consults: None  Significant Diagnostic Studies: labs: hgb 10.8   Treatments: surgery: vaginal hysterectomy bilateral salpingoophorectomy and anterior repair  Discharge Exam: Blood pressure (!) 106/58, pulse 61, temperature 98.3 F (36.8 C), temperature source Oral, resp. rate 16, height 5\' 6"  (1.676 m), weight 120 lb (54.4 kg), SpO2 96 %. General appearance: alert and cooperative GI: soft, non-tender; bowel sounds normal; no masses,  no organomegaly Extremities: extremities normal, atraumatic, no cyanosis or edema Skin: Skin color, texture, turgor normal. No rashes or lesions  Disposition: 01-Home or Self Care  Discharge Instructions    Call MD for:  persistant nausea and vomiting    Complete by:  As directed   Call MD for:  severe uncontrolled pain    Complete by:  As directed   Call MD for:  temperature >100.4    Complete by:  As directed   Diet general    Complete by:  As directed   Driving Restrictions    Complete by:  As directed   Avoid driving for 1 week   Increase activity slowly    Complete by:  As directed   Lifting restrictions    Complete by:  As directed   Avoid lifting over 10 lbs   May shower / Bathe    Complete by:  As directed   May walk up steps    Complete by:  As directed   Sexual Activity Restrictions    Complete by:  As directed   Avoid sex until approved by Dr.Tyana Butzer       Medication List    STOP taking these medications   PREMARIN vaginal cream Generic drug:  conjugated estrogens     TAKE these medications   ibuprofen 800 MG tablet Commonly known as:  ADVIL,MOTRIN Take 1 tablet (800 mg total) by mouth every 8 (eight) hours as needed (mild pain). What changed:  medication strength  how much to take  when to take this  reasons to take this   oxyCODONE-acetaminophen 5-325 MG tablet Commonly known as:  PERCOCET/ROXICET Take 1-2 tablets by mouth every 6 (six) hours as needed (moderate to severe pain (when tolerating fluids)).   valACYclovir 1000 MG tablet Commonly known as:  VALTREX TAKE 2 TABLETS BY MOUTH AS NEEDED FOR COLD SORES. REPEAT ONCE IN 12 HOURS EVERY 12 HOURS.   zolpidem 5 MG tablet Commonly known as:  AMBIEN TAKE 1 TABLET BY MOUTH AT BEDTIME AS NEEDED FOR INSOMNIA      Follow-up Information    Jeni Duling J., MD Follow up in 2 week(s).   Specialty:  Obstetrics and Gynecology Why:  patient should have an appt for postoperative visit in 2 weeks  Contact information: [address] [place] [address] [phone]           Signed: Catha Brow. 01/09/2016, 8:01 AM

## 2016-01-09 NOTE — Progress Notes (Signed)
Pt discharged to home with significant other.  Condition stable.  Pt ambulated to car with RN.  No equipment for home ordered at discharge.

## 2016-01-10 ENCOUNTER — Encounter (HOSPITAL_COMMUNITY): Payer: Self-pay | Admitting: Obstetrics and Gynecology

## 2018-07-14 DIAGNOSIS — Z719 Counseling, unspecified: Secondary | ICD-10-CM | POA: Insufficient documentation

## 2019-06-21 ENCOUNTER — Ambulatory Visit: Payer: BLUE CROSS/BLUE SHIELD | Attending: Internal Medicine

## 2019-06-21 DIAGNOSIS — Z23 Encounter for immunization: Secondary | ICD-10-CM | POA: Insufficient documentation

## 2019-06-21 NOTE — Progress Notes (Signed)
   Covid-19 Vaccination Clinic  Name:  Natalie Lutz    MRN: [id number] DOB: 1955-07-09  06/21/2019  Natalie Lutz was observed post Covid-19 immunization for 15 minutes without incidence. She was provided with Vaccine Information Sheet and instruction to access the V-Safe system.   Natalie Lutz was instructed to call 911 with any severe reactions post vaccine: Marland Kitchen Difficulty breathing  . Swelling of your face and throat  . A fast heartbeat  . A bad rash all over your body  . Dizziness and weakness    Immunizations Administered    Name Date Dose VIS Date Route   Pfizer COVID-19 Vaccine 06/21/2019 11:34 AM 0.3 mL 05/12/2019 Intramuscular   Manufacturer: [place]   Lot: [id number]   [place]: [id number]

## 2019-07-09 ENCOUNTER — Ambulatory Visit: Payer: Medicare Other | Attending: Internal Medicine

## 2019-07-09 DIAGNOSIS — Z23 Encounter for immunization: Secondary | ICD-10-CM | POA: Insufficient documentation

## 2019-07-09 NOTE — Progress Notes (Signed)
   Covid-19 Vaccination Clinic  Name:  Darneshia Levitz    MRN: [id number] DOB: 24-May-1956  07/09/2019  Ms. Graveline was observed post Covid-19 immunization for 15 minutes without incidence. She was provided with Vaccine Information Sheet and instruction to access the V-Safe system.   Ms. Pichette was instructed to call 911 with any severe reactions post vaccine: Marland Kitchen Difficulty breathing  . Swelling of your face and throat  . A fast heartbeat  . A bad rash all over your body  . Dizziness and weakness    Immunizations Administered    Name Date Dose VIS Date Route   Pfizer COVID-19 Vaccine 07/09/2019  1:05 PM 0.3 mL 05/12/2019 Intramuscular   Manufacturer: [place]   Lot: [id number]   [place]: [id number]

## 2019-07-11 ENCOUNTER — Ambulatory Visit: Payer: BLUE CROSS/BLUE SHIELD

## 2019-08-23 DIAGNOSIS — M25562 Pain in left knee: Secondary | ICD-10-CM | POA: Insufficient documentation

## 2019-09-25 ENCOUNTER — Ambulatory Visit: Payer: Medicare Other | Admitting: Cardiology

## 2020-07-10 ENCOUNTER — Other Ambulatory Visit: Payer: Medicare Other

## 2020-07-10 DIAGNOSIS — Z20822 Contact with and (suspected) exposure to covid-19: Secondary | ICD-10-CM

## 2020-07-11 LAB — NOVEL CORONAVIRUS, NAA: SARS-CoV-2, NAA: NOT DETECTED

## 2020-07-11 LAB — SARS-COV-2, NAA 2 DAY TAT

## 2020-07-27 DIAGNOSIS — Z20822 Contact with and (suspected) exposure to covid-19: Secondary | ICD-10-CM | POA: Diagnosis not present

## 2020-07-27 DIAGNOSIS — Z03818 Encounter for observation for suspected exposure to other biological agents ruled out: Secondary | ICD-10-CM | POA: Diagnosis not present

## 2020-08-21 DIAGNOSIS — F5101 Primary insomnia: Secondary | ICD-10-CM | POA: Diagnosis not present

## 2020-08-21 DIAGNOSIS — Z1159 Encounter for screening for other viral diseases: Secondary | ICD-10-CM | POA: Diagnosis not present

## 2020-08-21 DIAGNOSIS — E785 Hyperlipidemia, unspecified: Secondary | ICD-10-CM | POA: Diagnosis not present

## 2020-08-21 DIAGNOSIS — M8588 Other specified disorders of bone density and structure, other site: Secondary | ICD-10-CM | POA: Diagnosis not present

## 2020-08-21 DIAGNOSIS — Z23 Encounter for immunization: Secondary | ICD-10-CM | POA: Diagnosis not present

## 2020-08-21 DIAGNOSIS — R0600 Dyspnea, unspecified: Secondary | ICD-10-CM | POA: Diagnosis not present

## 2020-08-21 DIAGNOSIS — Z0000 Encounter for general adult medical examination without abnormal findings: Secondary | ICD-10-CM | POA: Diagnosis not present

## 2020-08-28 DIAGNOSIS — Z1231 Encounter for screening mammogram for malignant neoplasm of breast: Secondary | ICD-10-CM | POA: Diagnosis not present

## 2020-09-06 DIAGNOSIS — D72819 Decreased white blood cell count, unspecified: Secondary | ICD-10-CM | POA: Diagnosis not present

## 2020-10-30 DIAGNOSIS — D225 Melanocytic nevi of trunk: Secondary | ICD-10-CM | POA: Diagnosis not present

## 2020-10-30 DIAGNOSIS — L821 Other seborrheic keratosis: Secondary | ICD-10-CM | POA: Diagnosis not present

## 2020-10-30 DIAGNOSIS — L570 Actinic keratosis: Secondary | ICD-10-CM | POA: Diagnosis not present

## 2020-10-30 DIAGNOSIS — L918 Other hypertrophic disorders of the skin: Secondary | ICD-10-CM | POA: Diagnosis not present

## 2020-10-30 DIAGNOSIS — D1801 Hemangioma of skin and subcutaneous tissue: Secondary | ICD-10-CM | POA: Diagnosis not present

## 2020-10-30 DIAGNOSIS — L814 Other melanin hyperpigmentation: Secondary | ICD-10-CM | POA: Diagnosis not present

## 2021-01-21 DIAGNOSIS — M25511 Pain in right shoulder: Secondary | ICD-10-CM | POA: Diagnosis not present

## 2021-01-21 DIAGNOSIS — M542 Cervicalgia: Secondary | ICD-10-CM | POA: Diagnosis not present

## 2021-02-04 DIAGNOSIS — S46091D Other injury of muscle(s) and tendon(s) of the rotator cuff of right shoulder, subsequent encounter: Secondary | ICD-10-CM | POA: Diagnosis not present

## 2021-02-04 DIAGNOSIS — R531 Weakness: Secondary | ICD-10-CM | POA: Diagnosis not present

## 2021-02-11 DIAGNOSIS — S46091D Other injury of muscle(s) and tendon(s) of the rotator cuff of right shoulder, subsequent encounter: Secondary | ICD-10-CM | POA: Diagnosis not present

## 2021-02-11 DIAGNOSIS — R531 Weakness: Secondary | ICD-10-CM | POA: Diagnosis not present

## 2021-02-25 DIAGNOSIS — S46091D Other injury of muscle(s) and tendon(s) of the rotator cuff of right shoulder, subsequent encounter: Secondary | ICD-10-CM | POA: Diagnosis not present

## 2021-02-25 DIAGNOSIS — R531 Weakness: Secondary | ICD-10-CM | POA: Diagnosis not present

## 2021-02-26 DIAGNOSIS — H2513 Age-related nuclear cataract, bilateral: Secondary | ICD-10-CM | POA: Diagnosis not present

## 2021-03-20 ENCOUNTER — Other Ambulatory Visit: Payer: Self-pay

## 2021-03-20 ENCOUNTER — Encounter: Payer: Self-pay | Admitting: Podiatrist

## 2021-03-20 ENCOUNTER — Ambulatory Visit: Payer: Medicare Other | Admitting: Podiatrist

## 2021-03-20 DIAGNOSIS — M205X1 Other deformities of toe(s) (acquired), right foot: Secondary | ICD-10-CM | POA: Diagnosis not present

## 2021-03-20 DIAGNOSIS — L84 Corns and callosities: Secondary | ICD-10-CM | POA: Diagnosis not present

## 2021-03-20 DIAGNOSIS — M7752 Other enthesopathy of left foot: Secondary | ICD-10-CM | POA: Diagnosis not present

## 2021-03-20 DIAGNOSIS — M7751 Other enthesopathy of right foot: Secondary | ICD-10-CM

## 2021-03-20 NOTE — Progress Notes (Signed)
  Chief Complaint  Patient presents with   Foot Orthotics    Pt requesting a  pair of orthotics due to a callus at Rt hallux joint     HPI: Patient is 65 y.o. female who presents today for concern over a large callus on the plantar aspect of the right first metatarsal phalangeal joint. She relates she had her bunions fixed in her 20's and that the left one did well however the right one had a space between the big toe and second toe.  She had to go back in to have this fixed.  She has had calluses ever since and has them trimmed at her pedicures.  She is interested in orthotics.    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   No current facility-administered medications on file prior to visit.    No Known Allergies  Review of Systems No fevers, chills, nausea, muscle aches, no difficulty breathing, no calf pain, no chest pain or shortness of breath.   Physical Exam  GENERAL APPEARANCE: Alert, conversant. Appropriately groomed. No acute distress.   VASCULAR: Pedal pulses palpable DP and PT bilateral.  Capillary refill time is immediate to all digits,  Proximal to distal cooling it warm to warm.  Digital perfusion adequate.   NEUROLOGIC: sensation is intact to 5.07 monofilament at 5/5 sites bilateral.  Light touch is intact bilateral, vibratory sensation intact bilateral  MUSCULOSKELETAL: acceptable muscle strength, tone and stability bilateral.  Right foot first metatarsal head is prominent plantarly.  Hallux is medially oriented from a hallux varus repair.  Range of motion decreased at this joint.  Left foot has mild residual bunion with good range of motion at the first mp joint.    DERMATOLOGIC: skin is warm, supple, and dry.  Large plantar callus  is present right plantar first metatarsal head vs. Left.      Assessment   1. Hallux limitus of right foot   2. Capsulitis of metatarsophalangeal (MTP) joint of right foot   3. Callus of foot      Plan  Discussed exam findings with the patient and discussed we could try some orthotics to control motion and make a pocket for the first metatarsal to try and slow the callus formation.  I did the mold today in the office and will order her the orthotics.  She will be seen back when they are ready for pick up.

## 2021-04-14 ENCOUNTER — Telehealth: Payer: Self-pay | Admitting: Podiatrist

## 2021-04-14 NOTE — Telephone Encounter (Signed)
Pt left message asking about her orthotics that were ordered a few weeks ago and she has not heard anything.  I returned call and  left message they just came in end of last week. I have scheduled pt for 11.18 @ 1045 with Dr Valentina Lucks and left message for pt to call to confirm.

## 2021-04-18 ENCOUNTER — Ambulatory Visit: Payer: Medicare Other

## 2021-04-18 ENCOUNTER — Other Ambulatory Visit: Payer: Self-pay

## 2021-04-18 DIAGNOSIS — M7751 Other enthesopathy of right foot: Secondary | ICD-10-CM

## 2021-04-18 DIAGNOSIS — M205X1 Other deformities of toe(s) (acquired), right foot: Secondary | ICD-10-CM

## 2021-04-18 NOTE — Progress Notes (Signed)
SITUATION: Reason for Visit: Fitting and Delivery of Custom Fabricated Foot Orthoses Patient Report: Patient reports comfort and is satisfied with device.  OBJECTIVE DATA: Patient History / Diagnosis:  No change in pathology Provided Device:  Custom [employer] FOs  GOAL OF ORTHOSIS - Improve gait - Decrease energy expenditure - Improve Balance - Provide Triplanar stability of foot complex - Facilitate motion  ACTIONS PERFORMED Patient was fit with Fos. trimmed to shoe last. Patient tolerated fittign procedure.  Patient was provided with verbal and written instruction and demonstration regarding donning, doffing, wear, care, proper fit, function, purpose, cleaning, and use of the orthosis and in all related precautions and risks and benefits regarding the orthosis.  Patient was also provided with verbal instruction regarding how to report any failures or malfunctions of the orthosis and necessary follow up care. Patient was also instructed to contact our office regarding any change in status that may affect the function of the orthosis.  Patient demonstrated independence with proper donning, doffing, and fit and verbalized understanding of all instructions.  PLAN: Patient is to follow up in one week or as necessary (PRN). All questions were answered and concerns addressed. Plan of care was discussed with and agreed upon by the patient.

## 2021-09-01 DIAGNOSIS — M8588 Other specified disorders of bone density and structure, other site: Secondary | ICD-10-CM | POA: Diagnosis not present

## 2021-09-01 DIAGNOSIS — E785 Hyperlipidemia, unspecified: Secondary | ICD-10-CM | POA: Diagnosis not present

## 2021-09-01 DIAGNOSIS — Z23 Encounter for immunization: Secondary | ICD-10-CM | POA: Diagnosis not present

## 2021-09-01 DIAGNOSIS — F5101 Primary insomnia: Secondary | ICD-10-CM | POA: Diagnosis not present

## 2021-09-01 DIAGNOSIS — Z0000 Encounter for general adult medical examination without abnormal findings: Secondary | ICD-10-CM | POA: Diagnosis not present

## 2021-09-01 DIAGNOSIS — D72819 Decreased white blood cell count, unspecified: Secondary | ICD-10-CM | POA: Diagnosis not present

## 2021-09-03 DIAGNOSIS — Z1231 Encounter for screening mammogram for malignant neoplasm of breast: Secondary | ICD-10-CM | POA: Diagnosis not present

## 2021-09-03 DIAGNOSIS — M8589 Other specified disorders of bone density and structure, multiple sites: Secondary | ICD-10-CM | POA: Diagnosis not present

## 2021-09-03 DIAGNOSIS — Z780 Asymptomatic menopausal state: Secondary | ICD-10-CM | POA: Diagnosis not present

## 2021-11-07 DIAGNOSIS — U071 COVID-19: Secondary | ICD-10-CM | POA: Diagnosis not present

## 2021-11-07 DIAGNOSIS — S81011S Laceration without foreign body, right knee, sequela: Secondary | ICD-10-CM | POA: Diagnosis not present

## 2021-11-17 ENCOUNTER — Other Ambulatory Visit: Payer: Self-pay | Admitting: Physician Assistant

## 2021-11-17 ENCOUNTER — Ambulatory Visit
Admission: RE | Admit: 2021-11-17 | Discharge: 2021-11-17 | Disposition: A | Discharge status: Home / Self Care | Payer: Medicare Other | Source: Ambulatory Visit | Attending: Physician Assistant | Admitting: Physician Assistant

## 2021-11-17 DIAGNOSIS — M25561 Pain in right knee: Secondary | ICD-10-CM | POA: Diagnosis not present

## 2021-11-17 DIAGNOSIS — Z4802 Encounter for removal of sutures: Secondary | ICD-10-CM | POA: Diagnosis not present

## 2022-06-30 IMAGING — CR DG KNEE COMPLETE 4+V*R*
4 series · 4 of 4 positions shown · non-contrast
Comparison: None Available.

CLINICAL DATA: Right knee pain.  Fell 12 days ago.

EXAM:
RIGHT KNEE - COMPLETE 4+ VIEW

[w knee ap right]
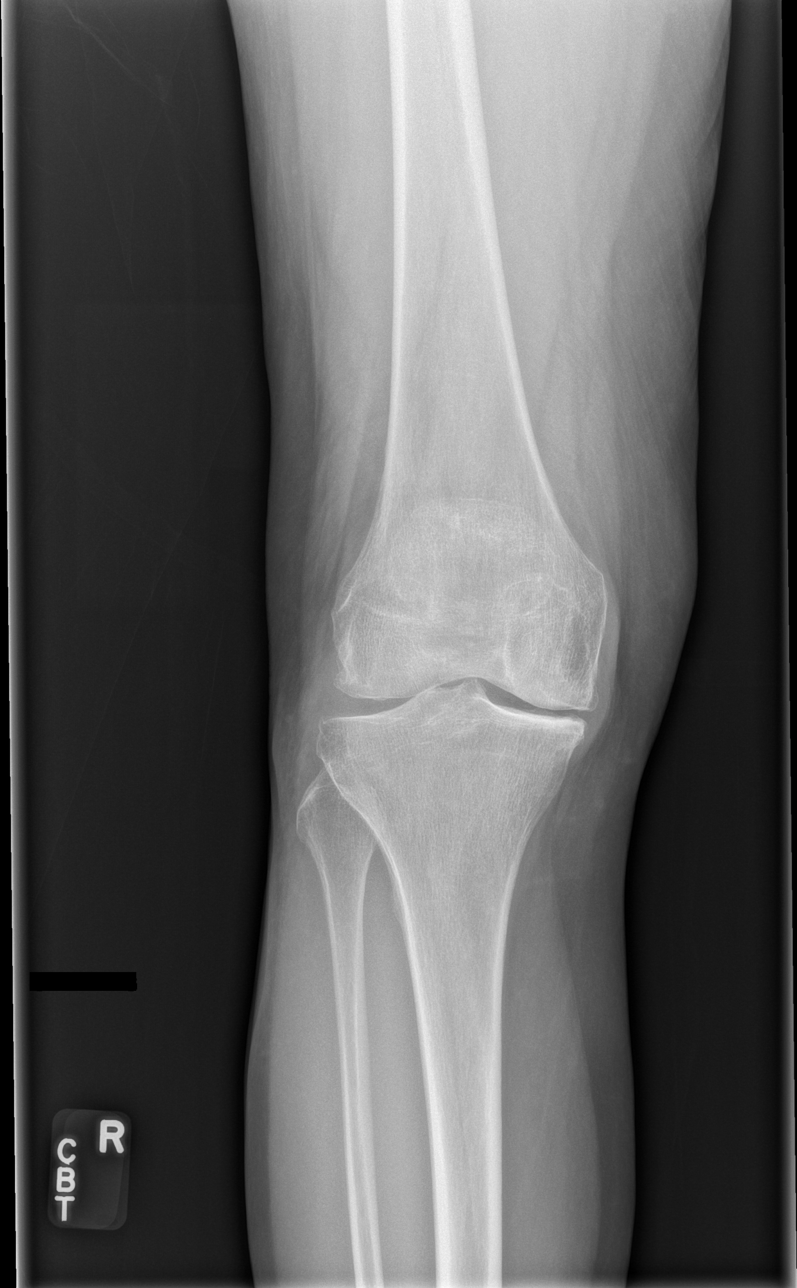

[w knee lat right]
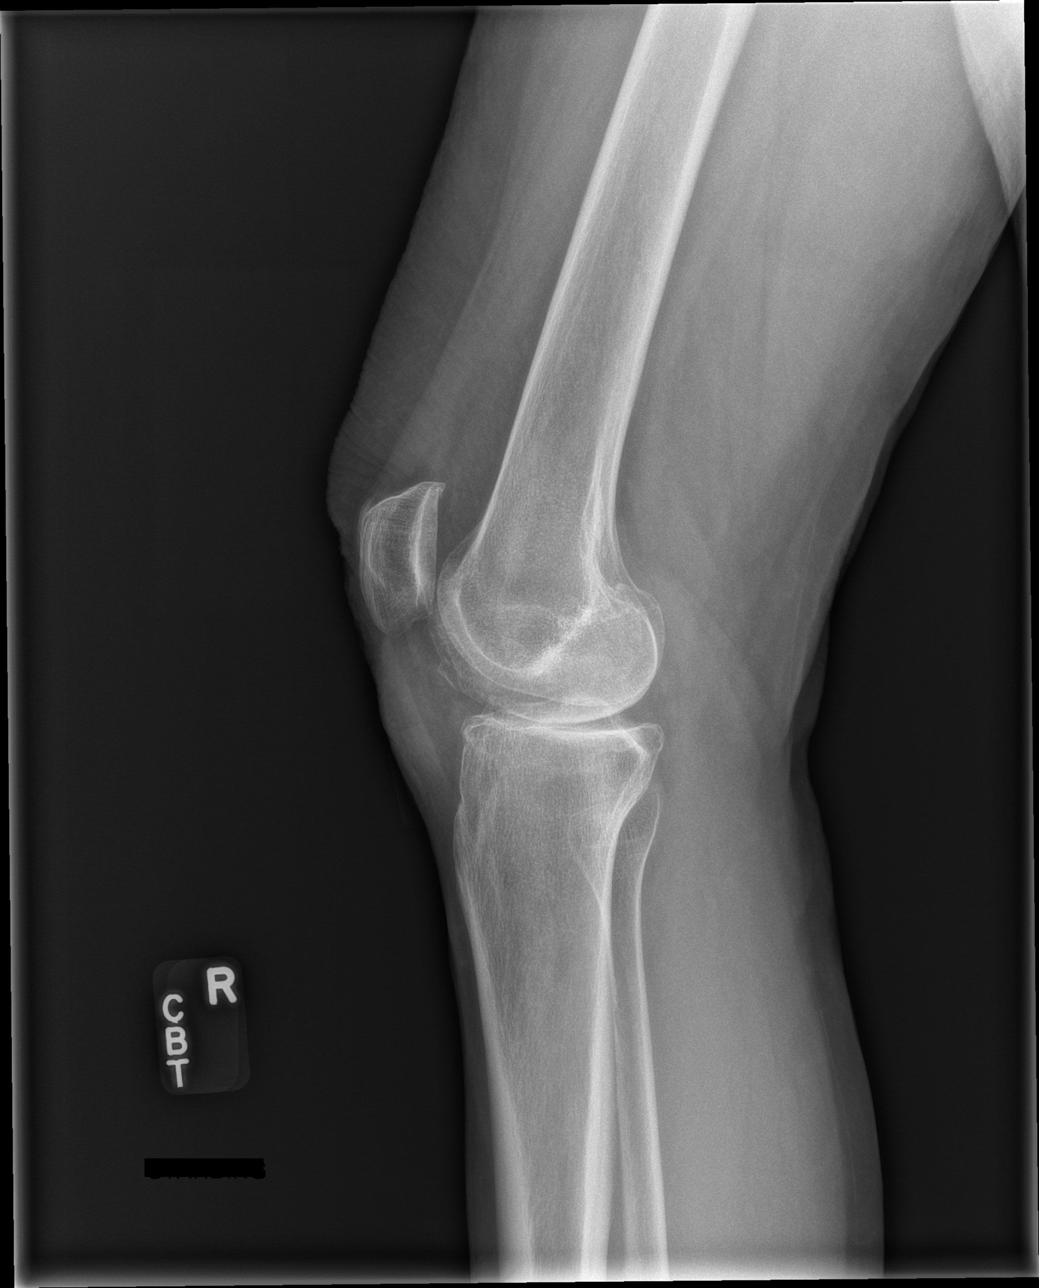

[w knee tunnel pa right]
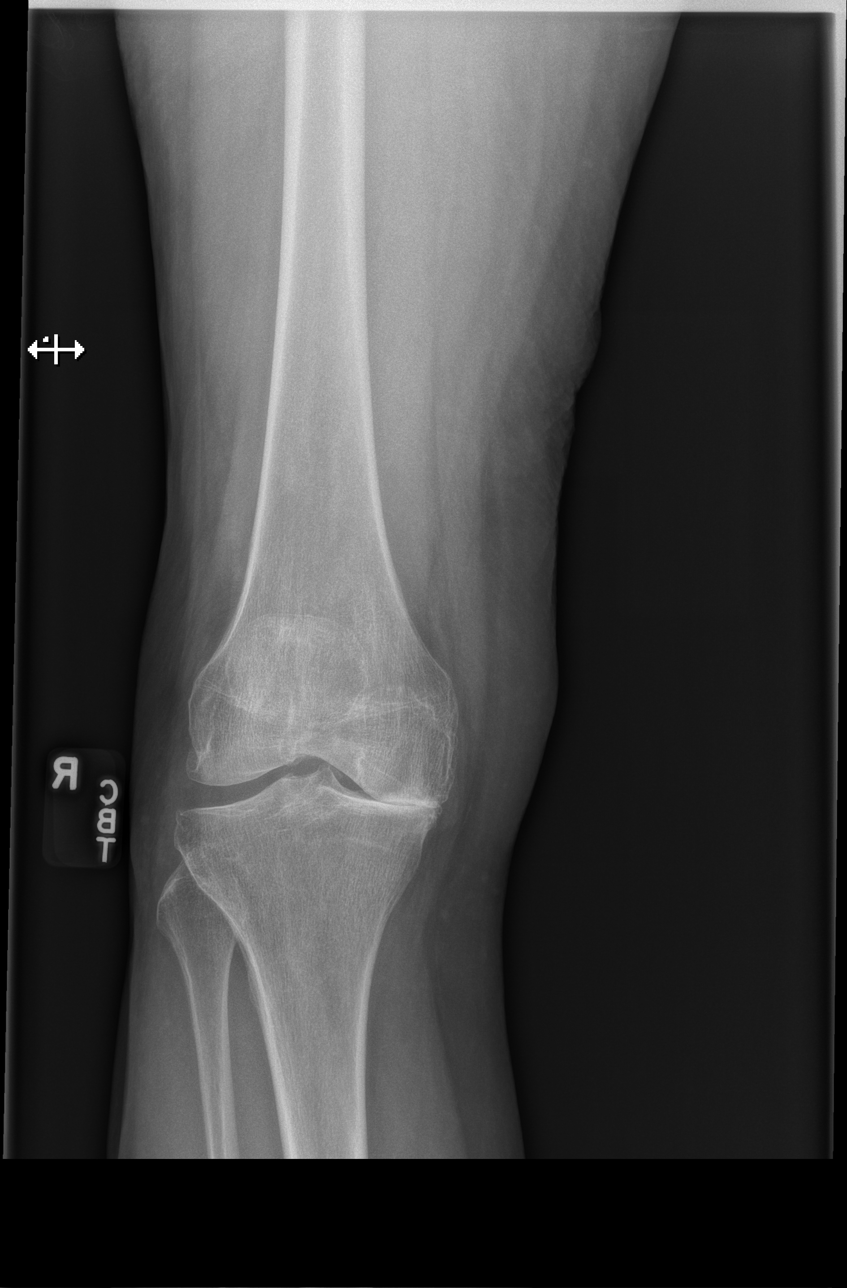

[x knee sunrise right]
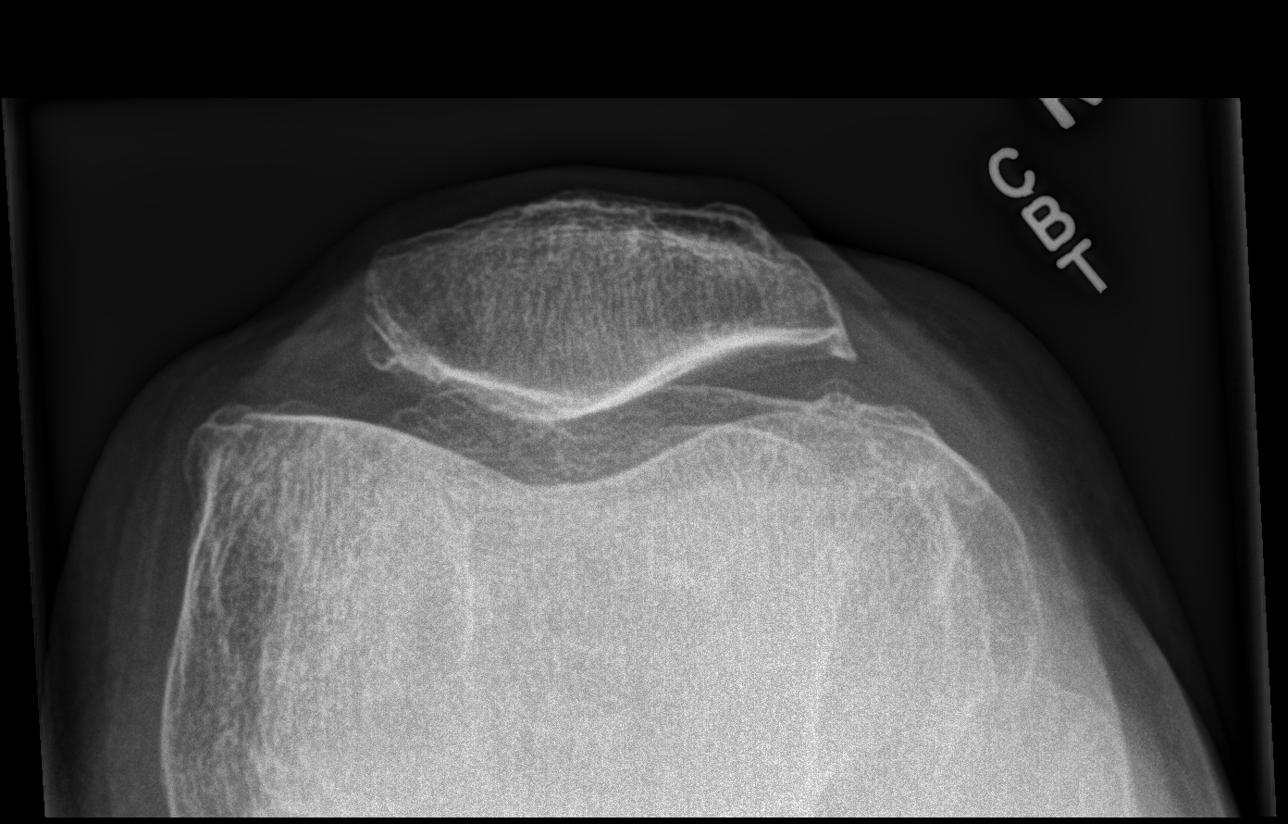

[4 of 4 positions shown; findings below may reference images not displayed]

FINDINGS: No fracture or dislocation. No joint effusion. Tricompartmental
degenerative changes most marked in the lateral compartment with
loss of joint space with standing. Enthesopathic changes are
associated with the superior patella.
IMPRESSION: Degenerative and enthesopathic changes as above. Loss of joint space
in the medial compartment with standing.

## 2022-07-21 DIAGNOSIS — L720 Epidermal cyst: Secondary | ICD-10-CM | POA: Diagnosis not present

## 2022-07-21 DIAGNOSIS — L570 Actinic keratosis: Secondary | ICD-10-CM | POA: Diagnosis not present

## 2022-07-21 DIAGNOSIS — D1801 Hemangioma of skin and subcutaneous tissue: Secondary | ICD-10-CM | POA: Diagnosis not present

## 2022-07-21 DIAGNOSIS — L814 Other melanin hyperpigmentation: Secondary | ICD-10-CM | POA: Diagnosis not present

## 2022-07-21 DIAGNOSIS — D225 Melanocytic nevi of trunk: Secondary | ICD-10-CM | POA: Diagnosis not present

## 2022-07-21 DIAGNOSIS — L820 Inflamed seborrheic keratosis: Secondary | ICD-10-CM | POA: Diagnosis not present

## 2022-07-21 DIAGNOSIS — L821 Other seborrheic keratosis: Secondary | ICD-10-CM | POA: Diagnosis not present

## 2022-07-22 ENCOUNTER — Telehealth: Payer: Self-pay | Admitting: Podiatrist

## 2022-07-22 NOTE — Telephone Encounter (Signed)
Patient called she lost her orthotics can she have another pair made like the 2022 ones?   Please advise , she is aware of cost and wants to proceed with ordering new pair like the ones she had.

## 2022-08-12 DIAGNOSIS — M170 Bilateral primary osteoarthritis of knee: Secondary | ICD-10-CM | POA: Diagnosis not present

## 2022-09-07 DIAGNOSIS — M170 Bilateral primary osteoarthritis of knee: Secondary | ICD-10-CM | POA: Diagnosis not present

## 2022-09-14 DIAGNOSIS — M170 Bilateral primary osteoarthritis of knee: Secondary | ICD-10-CM | POA: Diagnosis not present

## 2022-09-21 DIAGNOSIS — M170 Bilateral primary osteoarthritis of knee: Secondary | ICD-10-CM | POA: Diagnosis not present

## 2022-09-24 DIAGNOSIS — E785 Hyperlipidemia, unspecified: Secondary | ICD-10-CM | POA: Diagnosis not present

## 2022-09-24 DIAGNOSIS — F5101 Primary insomnia: Secondary | ICD-10-CM | POA: Diagnosis not present

## 2022-09-24 DIAGNOSIS — M170 Bilateral primary osteoarthritis of knee: Secondary | ICD-10-CM | POA: Diagnosis not present

## 2022-09-24 DIAGNOSIS — Z0000 Encounter for general adult medical examination without abnormal findings: Secondary | ICD-10-CM | POA: Diagnosis not present

## 2022-09-24 DIAGNOSIS — Z9181 History of falling: Secondary | ICD-10-CM | POA: Diagnosis not present

## 2022-09-24 DIAGNOSIS — M8588 Other specified disorders of bone density and structure, other site: Secondary | ICD-10-CM | POA: Diagnosis not present

## 2022-09-24 DIAGNOSIS — D72819 Decreased white blood cell count, unspecified: Secondary | ICD-10-CM | POA: Diagnosis not present

## 2022-09-30 DIAGNOSIS — Z1231 Encounter for screening mammogram for malignant neoplasm of breast: Secondary | ICD-10-CM | POA: Diagnosis not present

## 2022-10-07 DIAGNOSIS — H5213 Myopia, bilateral: Secondary | ICD-10-CM | POA: Diagnosis not present

## 2022-10-07 DIAGNOSIS — H2513 Age-related nuclear cataract, bilateral: Secondary | ICD-10-CM | POA: Diagnosis not present

## 2023-01-14 DIAGNOSIS — L820 Inflamed seborrheic keratosis: Secondary | ICD-10-CM | POA: Diagnosis not present

## 2023-01-14 DIAGNOSIS — L570 Actinic keratosis: Secondary | ICD-10-CM | POA: Diagnosis not present

## 2023-01-14 DIAGNOSIS — D0439 Carcinoma in situ of skin of other parts of face: Secondary | ICD-10-CM | POA: Diagnosis not present

## 2023-04-19 DIAGNOSIS — D0439 Carcinoma in situ of skin of other parts of face: Secondary | ICD-10-CM | POA: Diagnosis not present

## 2023-04-26 DIAGNOSIS — M79642 Pain in left hand: Secondary | ICD-10-CM | POA: Diagnosis not present

## 2023-04-26 DIAGNOSIS — M25521 Pain in right elbow: Secondary | ICD-10-CM | POA: Diagnosis not present

## 2023-05-03 DIAGNOSIS — M79642 Pain in left hand: Secondary | ICD-10-CM | POA: Diagnosis not present

## 2023-05-03 DIAGNOSIS — M25521 Pain in right elbow: Secondary | ICD-10-CM | POA: Diagnosis not present

## 2023-05-17 DIAGNOSIS — M79642 Pain in left hand: Secondary | ICD-10-CM | POA: Diagnosis not present

## 2023-05-17 DIAGNOSIS — M25521 Pain in right elbow: Secondary | ICD-10-CM | POA: Diagnosis not present

## 2023-08-16 DIAGNOSIS — L820 Inflamed seborrheic keratosis: Secondary | ICD-10-CM | POA: Diagnosis not present

## 2023-08-16 DIAGNOSIS — D2262 Melanocytic nevi of left upper limb, including shoulder: Secondary | ICD-10-CM | POA: Diagnosis not present

## 2023-08-16 DIAGNOSIS — L814 Other melanin hyperpigmentation: Secondary | ICD-10-CM | POA: Diagnosis not present

## 2023-08-16 DIAGNOSIS — D225 Melanocytic nevi of trunk: Secondary | ICD-10-CM | POA: Diagnosis not present

## 2023-08-16 DIAGNOSIS — D1801 Hemangioma of skin and subcutaneous tissue: Secondary | ICD-10-CM | POA: Diagnosis not present

## 2023-08-16 DIAGNOSIS — L570 Actinic keratosis: Secondary | ICD-10-CM | POA: Diagnosis not present

## 2023-08-16 DIAGNOSIS — L738 Other specified follicular disorders: Secondary | ICD-10-CM | POA: Diagnosis not present

## 2023-08-16 DIAGNOSIS — L821 Other seborrheic keratosis: Secondary | ICD-10-CM | POA: Diagnosis not present

## 2023-08-16 DIAGNOSIS — Z85828 Personal history of other malignant neoplasm of skin: Secondary | ICD-10-CM | POA: Diagnosis not present

## 2023-10-06 DIAGNOSIS — Z1231 Encounter for screening mammogram for malignant neoplasm of breast: Secondary | ICD-10-CM | POA: Diagnosis not present

## 2023-10-14 ENCOUNTER — Other Ambulatory Visit: Payer: Self-pay | Admitting: Family Medicine

## 2023-10-14 ENCOUNTER — Other Ambulatory Visit (HOSPITAL_BASED_OUTPATIENT_CLINIC_OR_DEPARTMENT_OTHER): Payer: Self-pay | Admitting: Family Medicine

## 2023-10-14 DIAGNOSIS — E785 Hyperlipidemia, unspecified: Secondary | ICD-10-CM | POA: Diagnosis not present

## 2023-10-14 DIAGNOSIS — F5101 Primary insomnia: Secondary | ICD-10-CM | POA: Diagnosis not present

## 2023-10-14 DIAGNOSIS — M25562 Pain in left knee: Secondary | ICD-10-CM

## 2023-10-14 DIAGNOSIS — Z791 Long term (current) use of non-steroidal anti-inflammatories (NSAID): Secondary | ICD-10-CM | POA: Diagnosis not present

## 2023-10-14 DIAGNOSIS — M170 Bilateral primary osteoarthritis of knee: Secondary | ICD-10-CM | POA: Diagnosis not present

## 2023-10-14 DIAGNOSIS — M8588 Other specified disorders of bone density and structure, other site: Secondary | ICD-10-CM | POA: Diagnosis not present

## 2023-10-14 DIAGNOSIS — Z0000 Encounter for general adult medical examination without abnormal findings: Secondary | ICD-10-CM | POA: Diagnosis not present

## 2023-10-22 ENCOUNTER — Ambulatory Visit (HOSPITAL_BASED_OUTPATIENT_CLINIC_OR_DEPARTMENT_OTHER)
Admission: RE | Admit: 2023-10-22 | Discharge: 2023-10-22 | Disposition: A | Discharge status: Home / Self Care | Source: Ambulatory Visit | Attending: Family Medicine | Admitting: Family Medicine

## 2023-10-22 ENCOUNTER — Ambulatory Visit (HOSPITAL_BASED_OUTPATIENT_CLINIC_OR_DEPARTMENT_OTHER)
Admission: RE | Admit: 2023-10-22 | Discharge: 2023-10-22 | Disposition: A | Discharge status: Home / Self Care | Payer: Self-pay | Source: Ambulatory Visit | Attending: Family Medicine | Admitting: Family Medicine

## 2023-10-22 DIAGNOSIS — M25462 Effusion, left knee: Secondary | ICD-10-CM | POA: Diagnosis not present

## 2023-10-22 DIAGNOSIS — M25562 Pain in left knee: Secondary | ICD-10-CM | POA: Diagnosis not present

## 2023-10-22 DIAGNOSIS — M7122 Synovial cyst of popliteal space [Baker], left knee: Secondary | ICD-10-CM | POA: Diagnosis not present

## 2023-10-22 DIAGNOSIS — E785 Hyperlipidemia, unspecified: Secondary | ICD-10-CM

## 2023-10-22 DIAGNOSIS — M1712 Unilateral primary osteoarthritis, left knee: Secondary | ICD-10-CM | POA: Diagnosis not present

## 2023-10-22 DIAGNOSIS — M8588 Other specified disorders of bone density and structure, other site: Secondary | ICD-10-CM | POA: Diagnosis not present

## 2023-10-26 ENCOUNTER — Encounter: Payer: Self-pay | Admitting: Family Medicine

## 2023-10-26 ENCOUNTER — Ambulatory Visit (INDEPENDENT_AMBULATORY_CARE_PROVIDER_SITE_OTHER): Admitting: Family Medicine

## 2023-10-26 VITALS — BP 126/82 | Ht 65.0 in | Wt 115.0 lb

## 2023-10-26 DIAGNOSIS — M25562 Pain in left knee: Secondary | ICD-10-CM | POA: Diagnosis not present

## 2023-10-26 DIAGNOSIS — M25561 Pain in right knee: Secondary | ICD-10-CM

## 2023-10-26 MED ORDER — MELOXICAM 15 MG PO TABS: 15.0000 mg | ORAL_TABLET | Freq: Every day | ORAL | 0 refills | Status: DC

## 2023-10-26 NOTE — Progress Notes (Signed)
 PCP: Victorio Grave, MD  Chief Complaint: L Knee pain Subjective:   HPI: Patient is a 68 y.o. female here for left knee pain. Pt notes worsening bilateral knee over the past few years. Her left knee has been bothering her more than her right.  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  Patient recently had a left knee MRI done for the knee pain.  Patient states that she usually takes ibuprofen  once or twice a day for the pain especially when she is playing pickle ball.  Patient notes some buckling but not nearly as often.  Patient also notes pain with flexion and extension of the knee.   History reviewed. No pertinent past medical history.  No current outpatient medications on file prior to visit.   No current facility-administered medications on file prior to visit.    Past Surgical History:  Procedure Laterality Date   BUNIONECTOMY Right    COLONOSCOPY     CYSTOCELE REPAIR N/A 01/08/2016   Procedure: POSSIBLE ANTERIOR REPAIR;  Surgeon: Arlee Lace, MD;  Location: [facility];  Service: Gynecology;  Laterality: N/A;   FRACTURE SURGERY     KNEE ARTHROSCOPY Bilateral    SALPINGOOPHORECTOMY Bilateral 01/08/2016   Procedure: BILATERAL SALPINGO OOPHORECTOMY;  Surgeon: Arlee Lace, MD;  Location: [facility];  Service: Gynecology;  Laterality: Bilateral;   VAGINAL HYSTERECTOMY N/A 01/08/2016   Procedure: HYSTERECTOMY VAGINAL;  Surgeon: Arlee Lace, MD;  Location: [facility];  Service: Gynecology;  Laterality: N/A;    No Known Allergies  BP 126/82   Ht 5\' 5"  (1.651 m)   Wt 115 lb (52.2 kg)   BMI 19.14 kg/m       No data to display              No data to display              Objective:  Physical Exam:  Gen: NAD, comfortable in exam room  Inspection: Inspection reveals no gross abnormalities of bilateral knee.  Minimal tenderness to palpation over the medial lateral joint lines.  Range of  motion is full with flexion extension.  Patella glide is appropriate and patella tracking is mildly abnormal.  There is some noted pain against resistance with flexion on the left knee more so than the right.  Valgus and varus negative.  McMurray negative.   Assessment & Plan:  1. 1. Acute pain of both knees (Primary) - Patient presenting with acute on chronic knee pain at this time.  Patient's pain is likely related to degenerative arthritic changes of the bilateral knee with the left knee being more prominent than the right at this time.  Patient's MRI do show degenerative meniscus tears as well as tricompartmental osteoarthritis. - Discussed with patient who would like to pursue conservative therapy at this time prior to any intervention.  Will go ahead and do meloxicam for the next 2 weeks, patient also given quadricep strengthening exercises as well as advised to continue wearing her bilateral knee compression sleeves.  Patient would like to try PRP for its anabolic effect, plan for PRP injection in 4 weeks if no improvement with current interventions.   - meloxicam (MOBIC) 15 MG tablet; Take 1 tablet (15 mg total) by mouth daily.  Dispense: 30 tablet; Refill: 0    Jude Norton MD, PGY-4  Sports Medicine Fellow [facility]

## 2023-11-23 ENCOUNTER — Other Ambulatory Visit: Payer: Self-pay

## 2023-11-23 ENCOUNTER — Ambulatory Visit (INDEPENDENT_AMBULATORY_CARE_PROVIDER_SITE_OTHER): Payer: Self-pay | Admitting: Family Medicine

## 2023-11-23 VITALS — BP 114/45 | Ht 65.0 in | Wt 115.0 lb

## 2023-11-23 DIAGNOSIS — M25562 Pain in left knee: Secondary | ICD-10-CM

## 2023-11-23 DIAGNOSIS — M1712 Unilateral primary osteoarthritis, left knee: Secondary | ICD-10-CM

## 2023-11-24 ENCOUNTER — Encounter: Payer: Self-pay | Admitting: Family Medicine

## 2023-11-24 NOTE — Progress Notes (Unsigned)
   Office Visit Note   Patient: Natalie Lutz           Date of Birth: [birth year]           MRN: [id number] Visit Date: 11/23/2023              Requested by: Teresa Channel, MD [phone] [place],  [place] [address] PCP: Teresa Channel, MD   Assessment & Plan: Visit Diagnoses:  1. Left knee pain, unspecified chronicity   2. Unilateral primary osteoarthritis, left knee     Plan: ***  Follow-Up Instructions: No follow-ups on file.   Orders:  Orders Placed This Encounter  Procedures   US  LIMITED JOINT SPACE STRUCTURES LOW LEFT   No orders of the defined types were placed in this encounter.     Procedures: No procedures performed   Clinical Data: No additional findings.   Subjective: No chief complaint on file.   HPI  Review of Systems   Objective: Vital Signs: BP (!) 114/45   Ht 5' 5 (1.651 m)   Wt 115 lb (52.2 kg)   BMI 19.14 kg/m   Physical Exam  Ortho Exam  Specialty Comments:  No specialty comments available.  Imaging: No results found.   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  No family history on file.  Past Surgical History:  Procedure Laterality Date   BUNIONECTOMY Right    COLONOSCOPY     CYSTOCELE REPAIR N/A 01/08/2016   Procedure: POSSIBLE ANTERIOR REPAIR;  Surgeon: Rexene Hoit, MD;  Location: [facility];  Service: Gynecology;  Laterality: N/A;   FRACTURE SURGERY     KNEE ARTHROSCOPY Bilateral    SALPINGOOPHORECTOMY Bilateral 01/08/2016   Procedure: BILATERAL SALPINGO OOPHORECTOMY;  Surgeon: Rexene Hoit, MD;  Location: [facility];  Service: Gynecology;  Laterality: Bilateral;   VAGINAL HYSTERECTOMY N/A 01/08/2016   Procedure: HYSTERECTOMY VAGINAL;  Surgeon: Rexene Hoit, MD;  Location: [facility];  Service:  Gynecology;  Laterality: N/A;   Social History   Occupational History   Not on file  Tobacco Use   Smoking status: Never   Smokeless tobacco: Never  Substance and Sexual Activity   Alcohol use: Yes    Alcohol/week: 0.0 standard drinks of alcohol   Drug use: No   Sexual activity: Not on file

## 2023-12-06 DIAGNOSIS — Z0100 Encounter for examination of eyes and vision without abnormal findings: Secondary | ICD-10-CM | POA: Diagnosis not present

## 2023-12-06 DIAGNOSIS — H2513 Age-related nuclear cataract, bilateral: Secondary | ICD-10-CM | POA: Diagnosis not present

## 2023-12-06 DIAGNOSIS — H33302 Unspecified retinal break, left eye: Secondary | ICD-10-CM | POA: Diagnosis not present

## 2023-12-06 DIAGNOSIS — H5213 Myopia, bilateral: Secondary | ICD-10-CM | POA: Diagnosis not present

## 2023-12-06 DIAGNOSIS — H527 Unspecified disorder of refraction: Secondary | ICD-10-CM | POA: Diagnosis not present

## 2023-12-13 ENCOUNTER — Other Ambulatory Visit: Payer: Self-pay

## 2023-12-13 DIAGNOSIS — M25561 Pain in right knee: Secondary | ICD-10-CM

## 2023-12-13 MED ORDER — MELOXICAM 15 MG PO TABS: 15.0000 mg | ORAL_TABLET | Freq: Every day | ORAL | 0 refills | Status: AC

## 2023-12-13 NOTE — Progress Notes (Signed)
 Pt called asking for refill on Meloxicam  15 mg.  Refill sent.

## 2024-03-08 ENCOUNTER — Other Ambulatory Visit: Payer: Self-pay | Admitting: Family Medicine

## 2024-03-08 DIAGNOSIS — M25561 Pain in right knee: Secondary | ICD-10-CM
